# Patient Record
Sex: Female | Born: 1985 | Race: White | Hispanic: No | Marital: Married
Health system: Southern US, Community
[De-identification: ages and names within clinical notes are randomized; demographics above are authoritative.]

---

## 2013-01-14 ENCOUNTER — Other Ambulatory Visit: Payer: Self-pay

## 2013-01-27 ENCOUNTER — Other Ambulatory Visit (HOSPITAL_COMMUNITY): Payer: Self-pay | Admitting: *Deleted

## 2013-01-27 DIAGNOSIS — Z3682 Encounter for antenatal screening for nuchal translucency: Secondary | ICD-10-CM

## 2013-02-02 ENCOUNTER — Encounter (HOSPITAL_COMMUNITY): Payer: Self-pay

## 2013-02-02 ENCOUNTER — Other Ambulatory Visit: Payer: Self-pay

## 2013-02-02 ENCOUNTER — Ambulatory Visit (HOSPITAL_COMMUNITY)
Admission: RE | Admit: 2013-02-02 | Discharge: 2013-02-02 | Disposition: A | Discharge status: Home / Self Care | Payer: BC Managed Care – PPO | Source: Ambulatory Visit | Attending: *Deleted | Admitting: *Deleted

## 2013-02-02 DIAGNOSIS — O351XX0 Maternal care for (suspected) chromosomal abnormality in fetus, not applicable or unspecified: Secondary | ICD-10-CM | POA: Insufficient documentation

## 2013-02-02 DIAGNOSIS — Z3689 Encounter for other specified antenatal screening: Secondary | ICD-10-CM | POA: Insufficient documentation

## 2013-02-02 DIAGNOSIS — O3510X0 Maternal care for (suspected) chromosomal abnormality in fetus, unspecified, not applicable or unspecified: Secondary | ICD-10-CM | POA: Insufficient documentation

## 2013-02-02 DIAGNOSIS — Z3682 Encounter for antenatal screening for nuchal translucency: Secondary | ICD-10-CM

## 2013-02-08 ENCOUNTER — Telehealth (HOSPITAL_COMMUNITY): Payer: Self-pay | Admitting: *Deleted

## 2013-02-08 NOTE — Telephone Encounter (Signed)
Called Elizabeth Shaw to inform her of her 1st trimester screen being within range.  Verified by birth date.  Pt verbalized understanding.  Pt has appointment march 3rd for anatomy scan.

## 2013-03-05 ENCOUNTER — Other Ambulatory Visit (HOSPITAL_COMMUNITY): Payer: Self-pay | Admitting: *Deleted

## 2013-03-05 DIAGNOSIS — Z3689 Encounter for other specified antenatal screening: Secondary | ICD-10-CM

## 2013-03-09 ENCOUNTER — Ambulatory Visit (HOSPITAL_COMMUNITY)
Admission: RE | Admit: 2013-03-09 | Discharge: 2013-03-09 | Disposition: A | Discharge status: Home / Self Care | Payer: BC Managed Care – PPO | Source: Ambulatory Visit | Attending: *Deleted | Admitting: *Deleted

## 2013-03-09 ENCOUNTER — Encounter (HOSPITAL_COMMUNITY): Payer: Self-pay

## 2013-03-09 DIAGNOSIS — Z363 Encounter for antenatal screening for malformations: Secondary | ICD-10-CM | POA: Insufficient documentation

## 2013-03-09 DIAGNOSIS — Z1389 Encounter for screening for other disorder: Secondary | ICD-10-CM | POA: Insufficient documentation

## 2013-03-09 DIAGNOSIS — O358XX0 Maternal care for other (suspected) fetal abnormality and damage, not applicable or unspecified: Secondary | ICD-10-CM | POA: Insufficient documentation

## 2013-03-09 DIAGNOSIS — Z3689 Encounter for other specified antenatal screening: Secondary | ICD-10-CM

## 2013-11-08 ENCOUNTER — Encounter (HOSPITAL_COMMUNITY): Payer: Self-pay

## 2013-12-08 ENCOUNTER — Encounter (HOSPITAL_COMMUNITY): Payer: Self-pay | Admitting: *Deleted

## 2015-01-15 IMAGING — US US OB NUCHAL TRANSLUCENCY 1ST GEST
1 series · 13 of 28 positions shown · non-contrast
Comparison: none

[Series 1: us ob nuchal translucency 1st gest · 0.07mm/px · 13 of 35 slices shown]
[im 2/35]
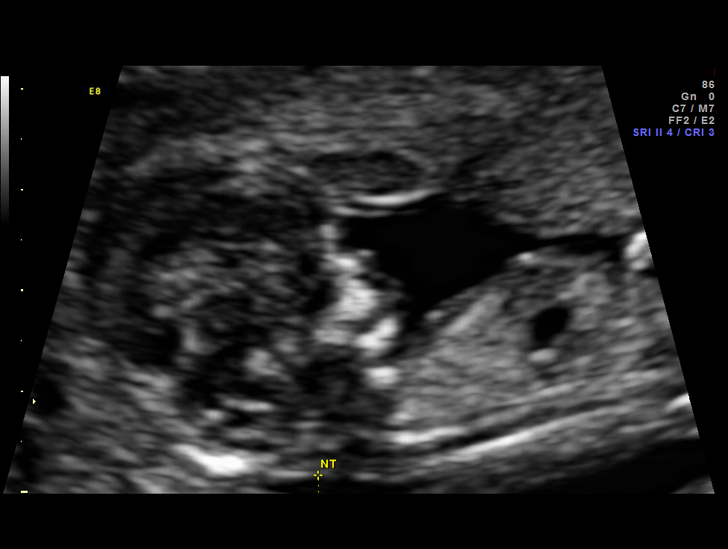
[im 4/35]
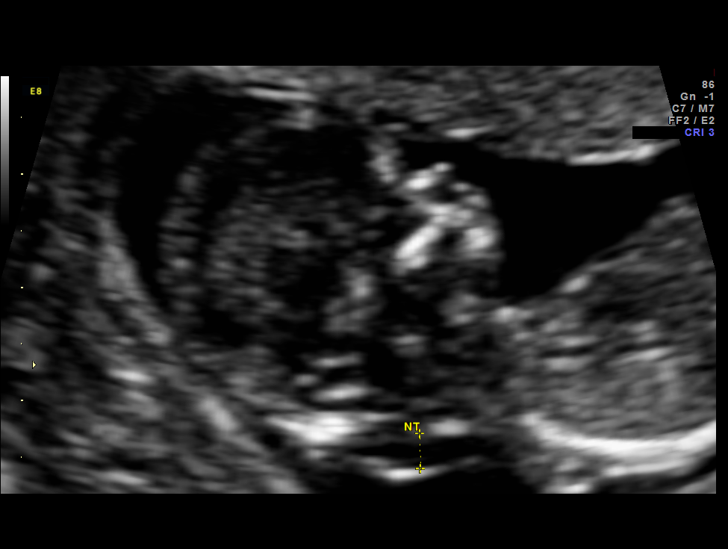
[im 7/35]
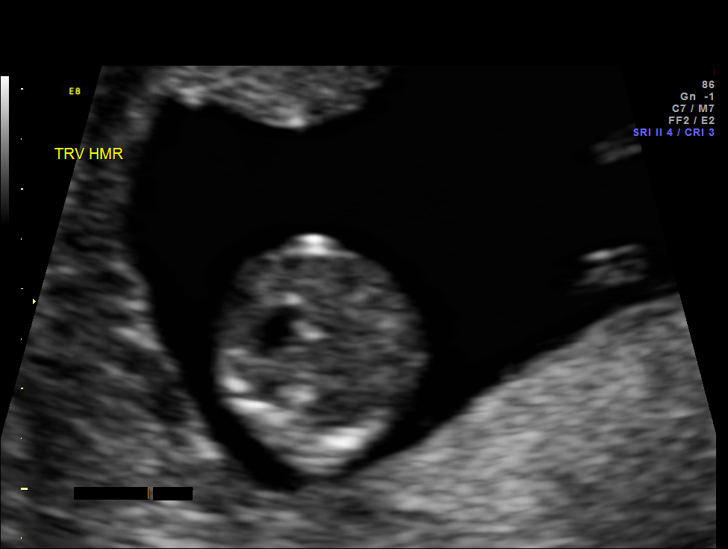
[im 9/35]
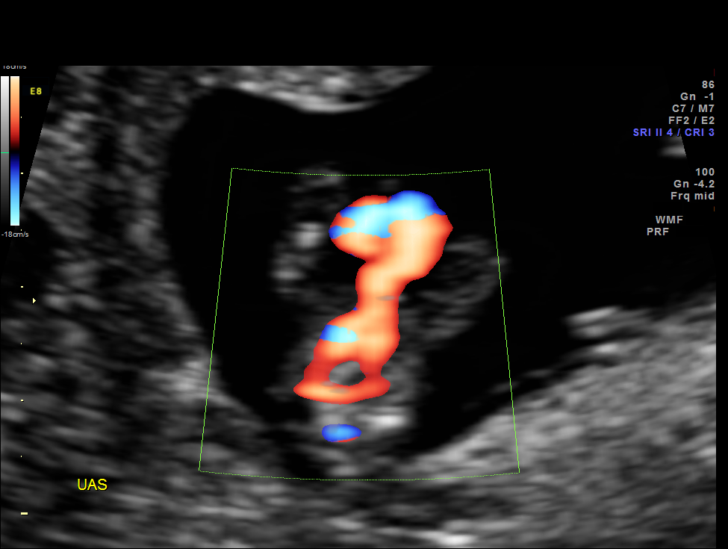
[im 12/35]
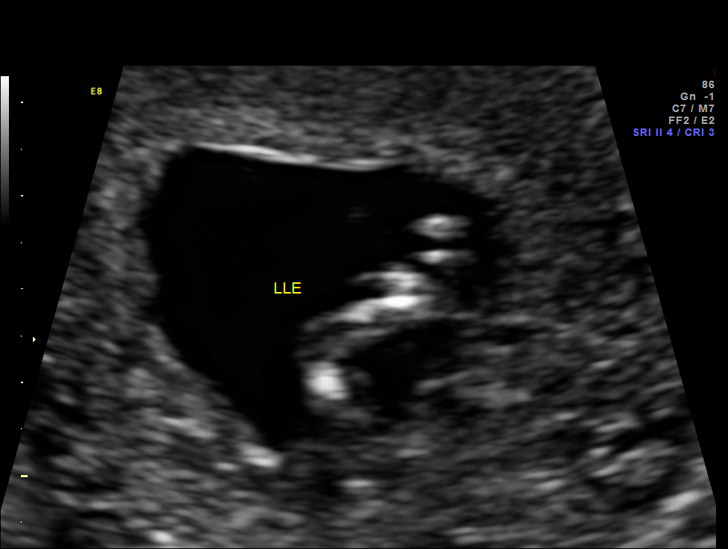
[im 14/35]
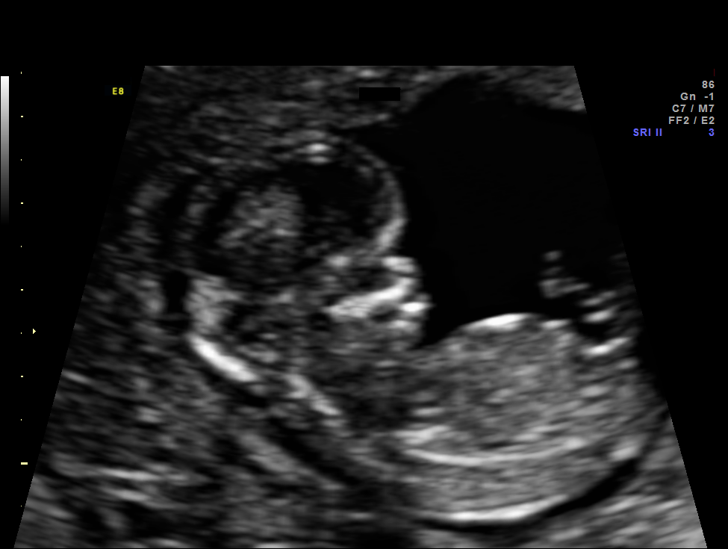
[im 18/35]
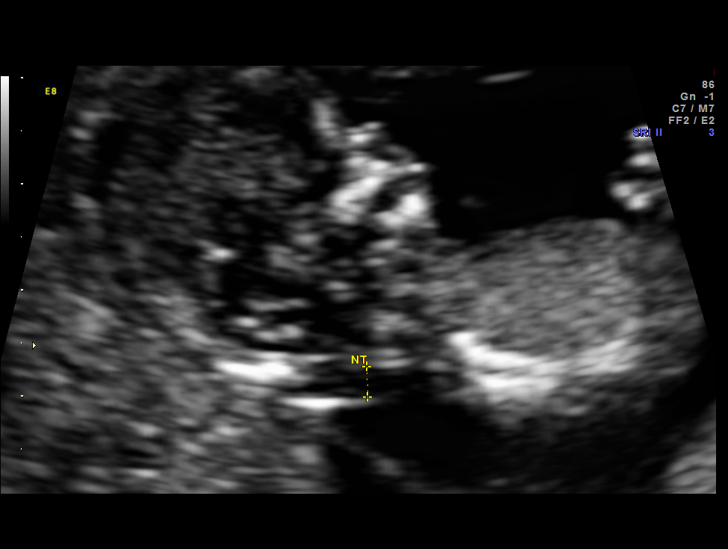
[im 21/35]
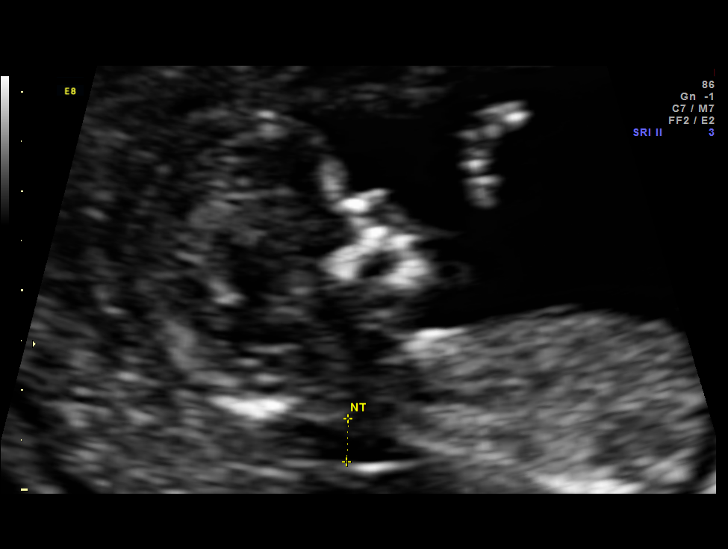
[im 23/35]
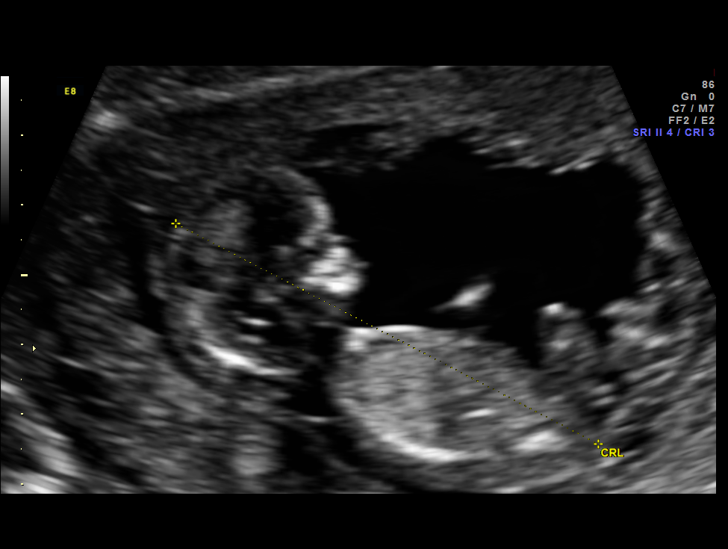
[im 26/35]
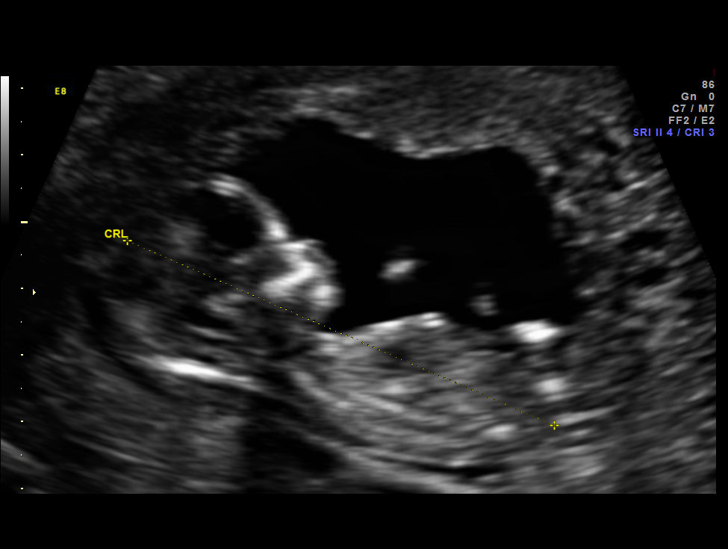
[im 28/35]
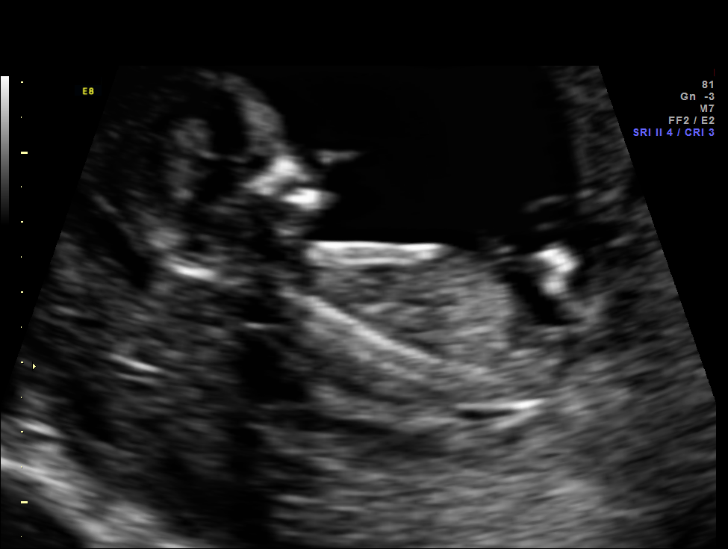
[im 31/35]
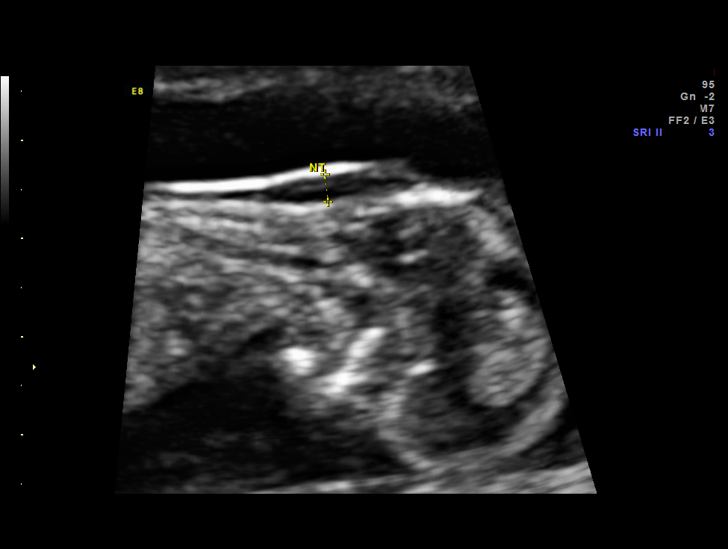
[im 33/35]
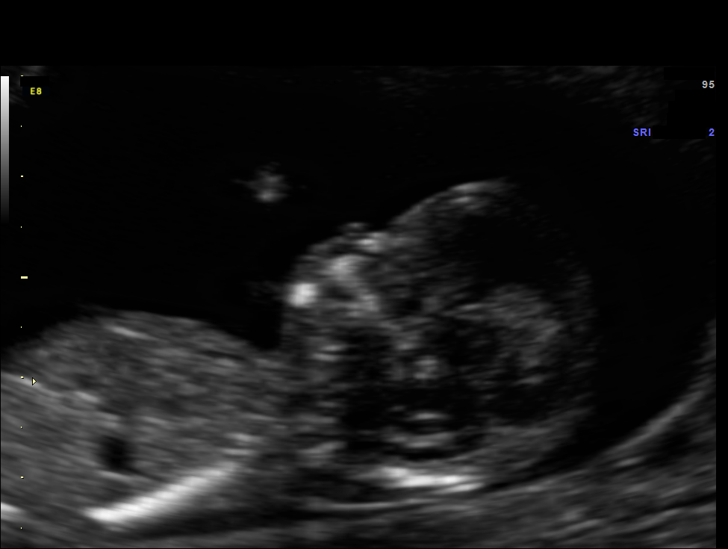

[13 of 28 positions shown; findings below may reference images not displayed]

OBSTETRICS REPORT
                      (Signed Final 02/02/2013 [DATE])

Service(s) Provided

 US FETAL NUCHAL TRANSLUCENCY                          76813.0
 MEASUREMENT
Indications

 First trimester aneuploidy screen (NT)
Fetal Evaluation

 Num Of Fetuses:    1
 Fetal Heart Rate:  153                          bpm
 Cardiac Activity:  Observed
 Presentation:      Transverse, head to
                    maternal right

 Amniotic Fluid
 AFI FV:      Subjectively within normal limits
Gestational Age

 LMP:           12w 1d        Date:  11/09/12                 EDD:   08/16/13
 Best:          13w 2d     Det. By:  Early Ultrasound         EDD:   08/08/13
                                     (01/21/13)
1st Trimester Genetic Sonogram Screening

 CRL:            68.7  mm    G. Age:   13w 0d                 EDD:   08/10/13
 Nuc Trans:       2.8  mm
 Nasal Bone:                 Present
Anatomy

 Cranium:          Appears normal         Cord Vessels:     Appears normal (3
                                                            vessel cord)
 Choroid Plexus:   Appears normal         Lower             Visualized
                                          Extremities:
 Stomach:          Appears normal, left   Upper             Visualized
                   sided                  Extremities:
 Abdominal Wall:   Appears nml (cord
                   insert, abd wall)
Cervix Uterus Adnexa

 Cervix:       Normal appearance by transabdominal scan.
Impression

 IUP at 13+2 weeks
 No gross abnormalities identified; ?? some profile views
 appeared abnormal but others were OK
 NT measurement was within normal limits for this GA (NT at
 the 96th %tile); NB present
 Normal amniotic fluid volume
 Measurements consistent with prior US
Recommendations

 Follow-up ultrasound in 5 weeks for detailed anatomic survey

 questions or concerns.

## 2015-02-19 IMAGING — US US OB DETAIL+14 WK
1 series · 12 of 28 positions shown · non-contrast
Comparison: none

[Series 1: us ob detail+14 wk · 0.18mm/px · 12 of 103 slices shown]
[im 4/103]
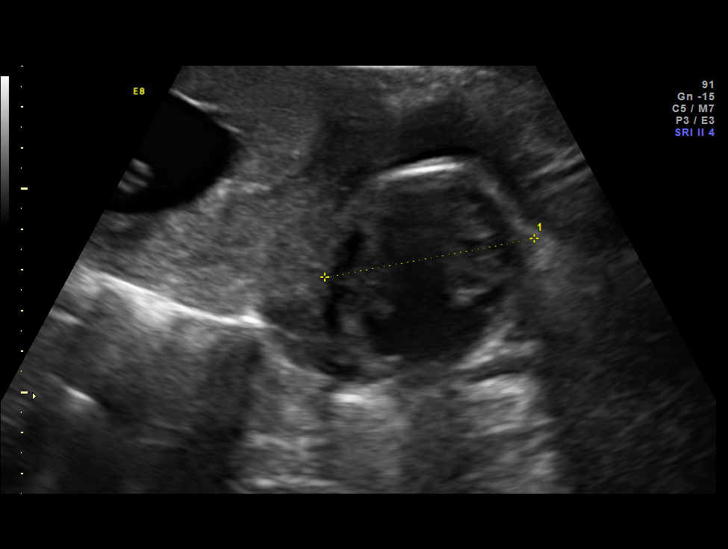
[im 12/103]
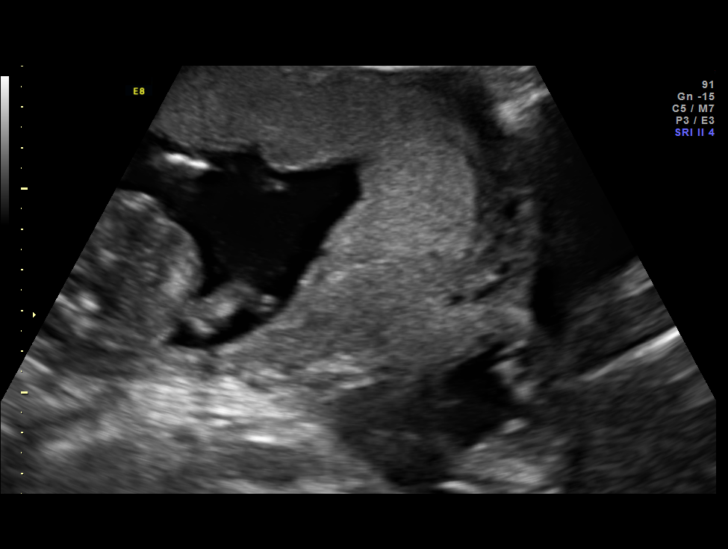
[im 19/103]
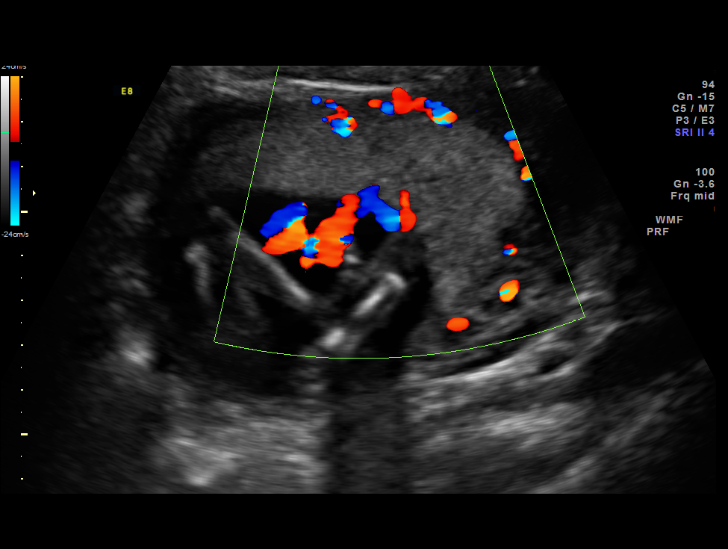
[im 31/103]
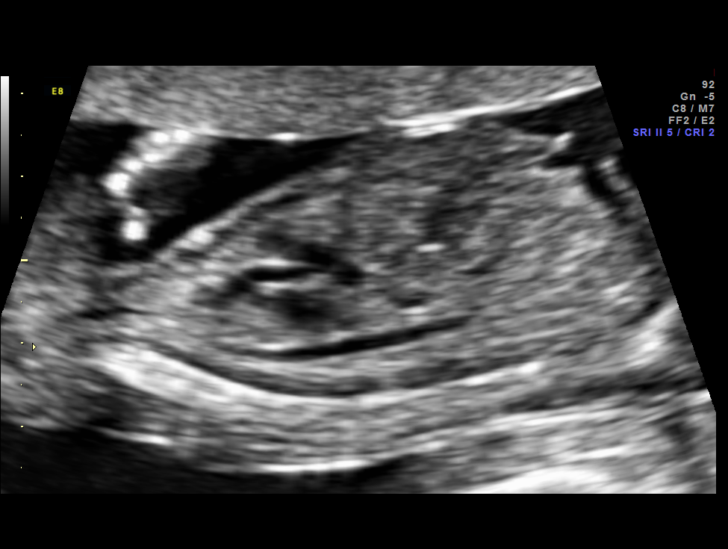
[im 38/103]
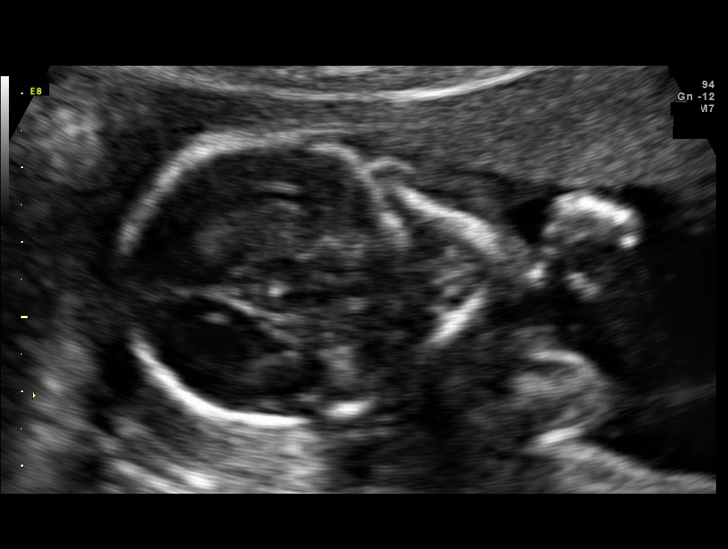
[im 46/103]
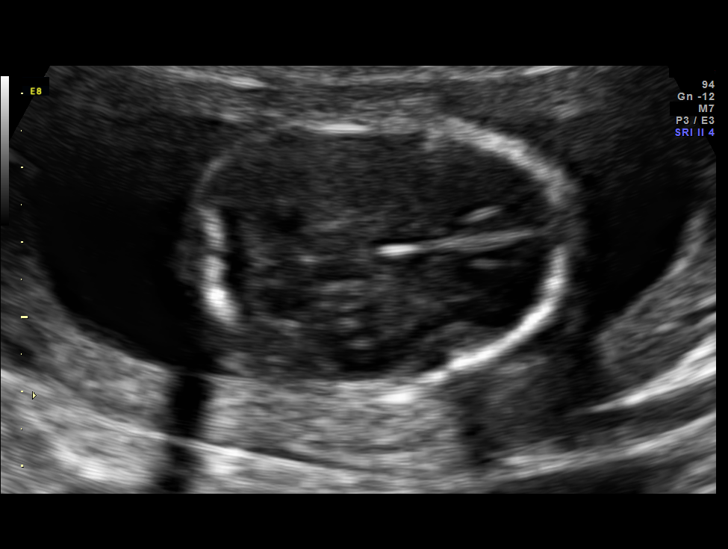
[im 57/103]
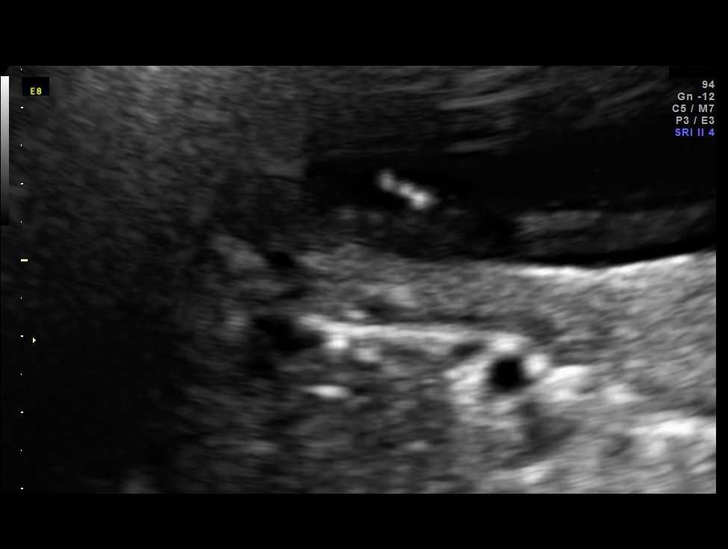
[im 65/103]
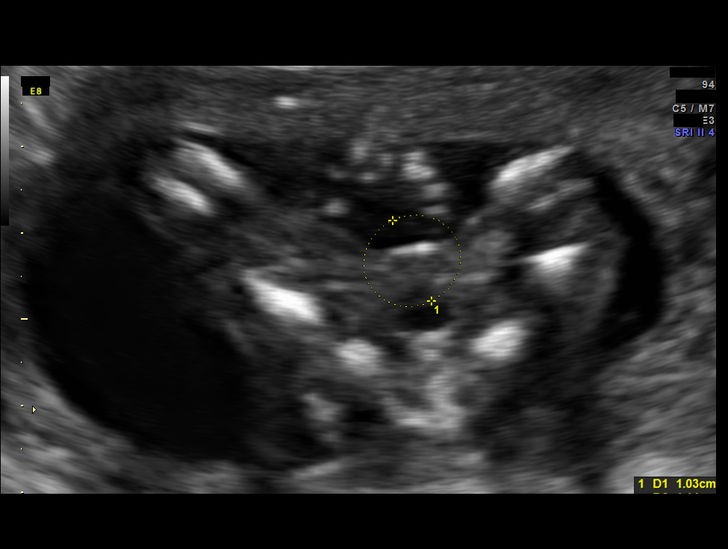
[im 72/103]
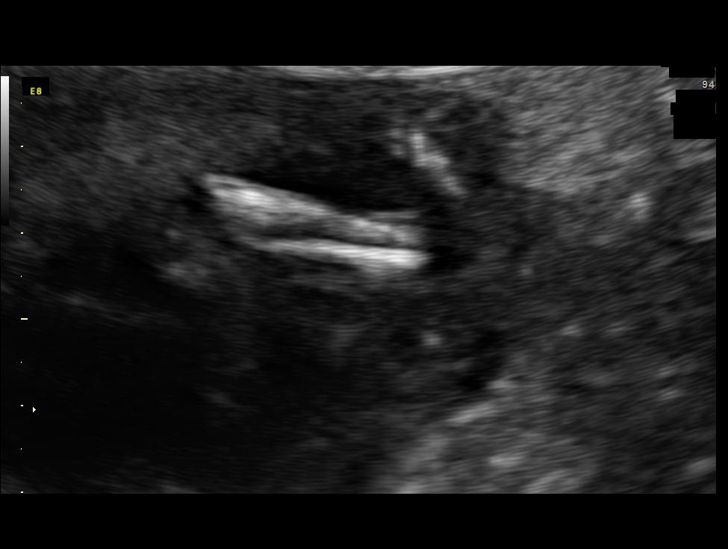
[im 84/103]
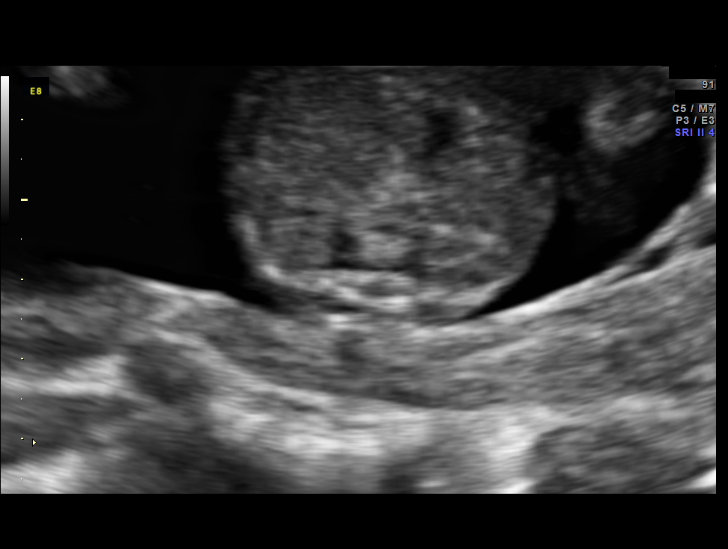
[im 91/103]
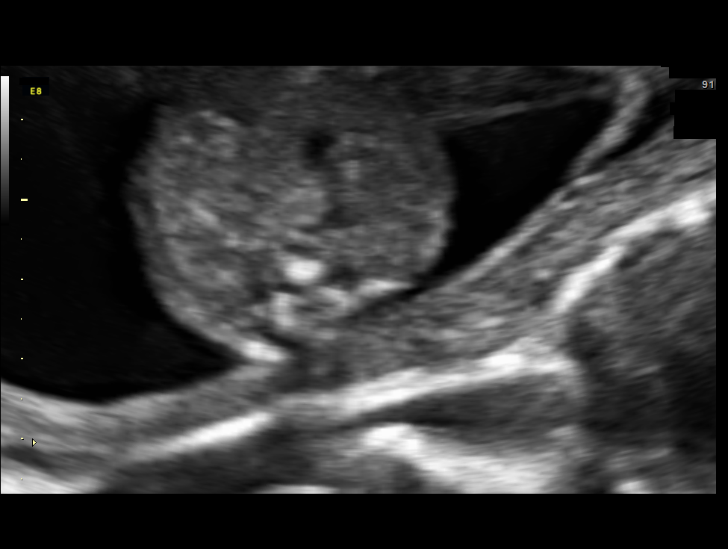
[im 99/103]
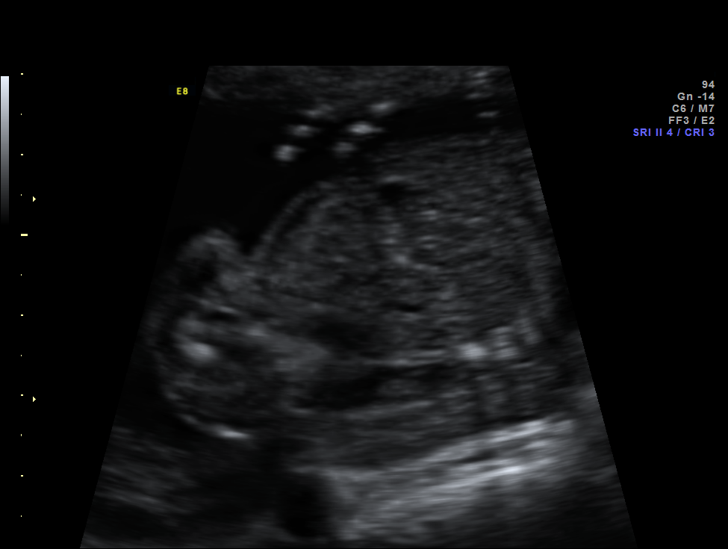

[12 of 28 positions shown; findings below may reference images not displayed]

OBSTETRICS REPORT
                      (Signed Final 03/09/2013 [DATE])

Service(s) Provided

 US OB DETAIL + 14 WK                                  76811.0
Indications

 Detailed fetal anatomic survey
 Fetal abnormality - thick NT- normal 1st trimester
 screen; low risk NIPS
Fetal Evaluation

 Num Of Fetuses:    1
 Fetal Heart Rate:  148                          bpm
 Cardiac Activity:  Observed
 Presentation:      Breech
 Placenta:          Anterior, above cervical os
 P. Cord            Visualized
 Insertion:

 Amniotic Fluid
 AFI FV:      Subjectively within normal limits
                                             Larg Pckt:     3.8  cm
Biometry

 BPD:     37.4  mm     G. Age:  17w 3d                CI:         68.4   70 - 86
 OFD:     54.7  mm                                    FL/HC:      17.1   15.8 -
                                                                         18
 HC:     148.9  mm     G. Age:  18w 0d       28  %    HC/AC:      1.15   1.07 -

 AC:     129.9  mm     G. Age:  18w 4d       55  %    FL/BPD:
 FL:      25.4  mm     G. Age:  17w 5d       24  %    FL/AC:      19.6   20 - 24
 HUM:     25.7  mm     G. Age:  18w 0d       48  %
 CER:     17.9  mm     G. Age:  17w 6d       37  %
 NFT:      2.7  mm

 Est. FW:     224  gm      0 lb 8 oz     43  %
Gestational Age

 LMP:           17w 1d        Date:  11/09/12                 EDD:   08/16/13
 U/S Today:     17w 6d                                        EDD:   08/11/13
 Best:          18w 2d     Det. By:  Early Ultrasound         EDD:   08/08/13
                                     (01/21/13)
Anatomy

 Cranium:          Appears normal         Aortic Arch:      Appears normal
 Fetal Cavum:      Appears normal         Ductal Arch:      Appears normal
 Ventricles:       Appears normal         Diaphragm:        Appears normal
 Choroid Plexus:   Appears normal         Stomach:          Appears normal
 Cerebellum:       Appears normal         Abdomen:          Appears normal
 Posterior Fossa:  Appears normal         Abdominal Wall:   Appears nml (cord
                                                            insert, abd wall)
 Nuchal Fold:      Appears normal         Cord Vessels:     Appears normal (3
                                                            vessel cord)
 Face:             Appears normal         Kidneys:          Appear normal
                   (orbits and profile)
 Lips:             Appears normal         Bladder:          Appears normal
 Palate:           Appears normal         Spine:            Appears normal
 Heart:            Appears normal         Lower             Appears normal
                   (4CH, axis, and        Extremities:
                   situs)
 RVOT:             Appears normal         Upper             Appears normal
                                          Extremities:
 LVOT:             Appears normal

 Other:  Fetus appears to be a female. Heels and 5th digit appear normal.
Targeted Anatomy

 Fetal Central Nervous System
 Cisterna Magna:
Cervix Uterus Adnexa

 Cervical Length:    4        cm

 Cervix:       Normal appearance by transabdominal scan. Appears
               closed, without funnelling.

 Left Ovary:    Within normal limits.
 Right Ovary:   Within normal limits.
Impression

 IUP at 18+2 weeks
 Normal detailed fetal anatomy
 Markers of aneuploidy: none
 Normal amniotic fluid volume
 Measurements consistent with prior [facility]

 Ms. Hj Zulkhairi was offered a fetal ECHO as part of the work-up
 for increased NT measurement. She will call back if she
 desires one.
Recommendations

 Follow-up as clinically indicated

 questions or concerns.
